# Patient Record
Sex: Male | Born: 1990 | Hispanic: Yes | Marital: Single | Smoking: Never smoker
Health system: Southern US, Community
[De-identification: ages and names within clinical notes are randomized; demographics above are authoritative.]

## PROBLEM LIST (undated history)

## (undated) ENCOUNTER — Emergency Department (HOSPITAL_COMMUNITY): Admission: EM | Payer: Self-pay

---

## 2014-06-07 ENCOUNTER — Emergency Department (HOSPITAL_COMMUNITY): Payer: No Typology Code available for payment source

## 2014-06-07 ENCOUNTER — Encounter (HOSPITAL_COMMUNITY): Payer: Self-pay | Admitting: *Deleted

## 2014-06-07 ENCOUNTER — Inpatient Hospital Stay (HOSPITAL_COMMUNITY)
Admission: EM | Admit: 2014-06-07 | Discharge: 2014-06-09 | DRG: 987 | Disposition: A | Discharge status: Home / Self Care | Payer: No Typology Code available for payment source | Attending: Otolaryngology | Admitting: Otolaryngology

## 2014-06-07 DIAGNOSIS — S065X9A Traumatic subdural hemorrhage with loss of consciousness of unspecified duration, initial encounter: Secondary | ICD-10-CM | POA: Diagnosis present

## 2014-06-07 DIAGNOSIS — S022XXA Fracture of nasal bones, initial encounter for closed fracture: Secondary | ICD-10-CM | POA: Diagnosis not present

## 2014-06-07 DIAGNOSIS — I6200 Nontraumatic subdural hemorrhage, unspecified: Secondary | ICD-10-CM | POA: Diagnosis not present

## 2014-06-07 DIAGNOSIS — S0081XA Abrasion of other part of head, initial encounter: Secondary | ICD-10-CM | POA: Diagnosis present

## 2014-06-07 DIAGNOSIS — G930 Cerebral cysts: Secondary | ICD-10-CM | POA: Diagnosis present

## 2014-06-07 DIAGNOSIS — S01112A Laceration without foreign body of left eyelid and periocular area, initial encounter: Secondary | ICD-10-CM | POA: Diagnosis present

## 2014-06-07 DIAGNOSIS — S065X0A Traumatic subdural hemorrhage without loss of consciousness, initial encounter: Secondary | ICD-10-CM | POA: Diagnosis present

## 2014-06-07 DIAGNOSIS — S065XAA Traumatic subdural hemorrhage with loss of consciousness status unknown, initial encounter: Secondary | ICD-10-CM | POA: Diagnosis present

## 2014-06-07 DIAGNOSIS — S01111A Laceration without foreign body of right eyelid and periocular area, initial encounter: Secondary | ICD-10-CM | POA: Diagnosis present

## 2014-06-07 DIAGNOSIS — Y92410 Unspecified street and highway as the place of occurrence of the external cause: Secondary | ICD-10-CM

## 2014-06-07 DIAGNOSIS — Z880 Allergy status to penicillin: Secondary | ICD-10-CM

## 2014-06-07 DIAGNOSIS — S0181XA Laceration without foreign body of other part of head, initial encounter: Secondary | ICD-10-CM | POA: Diagnosis present

## 2014-06-07 DIAGNOSIS — S1191XA Laceration without foreign body of unspecified part of neck, initial encounter: Secondary | ICD-10-CM | POA: Diagnosis present

## 2014-06-07 LAB — URINE MICROSCOPIC-ADD ON

## 2014-06-07 LAB — COMPREHENSIVE METABOLIC PANEL
ALK PHOS: 75 U/L (ref 39–117)
ALT: 56 U/L — AB (ref 0–53)
AST: 65 U/L — AB (ref 0–37)
Albumin: 4.1 g/dL (ref 3.5–5.2)
Anion gap: 9 (ref 5–15)
BUN: 12 mg/dL (ref 6–23)
CO2: 26 mmol/L (ref 19–32)
Calcium: 9.3 mg/dL (ref 8.4–10.5)
Chloride: 103 mEq/L (ref 96–112)
Creatinine, Ser: 0.99 mg/dL (ref 0.50–1.35)
GFR calc Af Amer: >90 mL/min (ref >90)
Glucose, Bld: 144 mg/dL — ABNORMAL HIGH (ref 70–99)
POTASSIUM: 3.6 mmol/L (ref 3.5–5.1)
SODIUM: 138 mmol/L (ref 135–145)
Total Bilirubin: 0.4 mg/dL (ref 0.3–1.2)
Total Protein: 6.8 g/dL (ref 6.0–8.3)

## 2014-06-07 LAB — URINALYSIS, ROUTINE W REFLEX MICROSCOPIC
Bilirubin Urine: NEGATIVE
GLUCOSE, UA: NEGATIVE mg/dL
Ketones, ur: NEGATIVE mg/dL
LEUKOCYTES UA: NEGATIVE
Nitrite: NEGATIVE
PROTEIN: NEGATIVE mg/dL
SPECIFIC GRAVITY, URINE: 1.045 — AB (ref 1.005–1.030)
Urobilinogen, UA: 0.2 mg/dL (ref 0.0–1.0)
pH: 6 (ref 5.0–8.0)

## 2014-06-07 LAB — CBC WITH DIFFERENTIAL/PLATELET
BASOS ABS: 0 10*3/uL (ref 0.0–0.1)
Basophils Relative: 0 % (ref 0–1)
Eosinophils Absolute: 0.1 10*3/uL (ref 0.0–0.7)
Eosinophils Relative: 0 % (ref 0–5)
HCT: 42 % (ref 39.0–52.0)
Hemoglobin: 14.6 g/dL (ref 13.0–17.0)
LYMPHS PCT: 10 % — AB (ref 12–46)
Lymphs Abs: 2.2 10*3/uL (ref 0.7–4.0)
MCH: 29.4 pg (ref 26.0–34.0)
MCHC: 34.8 g/dL (ref 30.0–36.0)
MCV: 84.7 fL (ref 78.0–100.0)
Monocytes Absolute: 1.3 10*3/uL — ABNORMAL HIGH (ref 0.1–1.0)
Monocytes Relative: 6 % (ref 3–12)
Neutro Abs: 18.7 10*3/uL — ABNORMAL HIGH (ref 1.7–7.7)
Neutrophils Relative %: 84 % — ABNORMAL HIGH (ref 43–77)
PLATELETS: 337 10*3/uL (ref 150–400)
RBC: 4.96 MIL/uL (ref 4.22–5.81)
RDW: 12.5 % (ref 11.5–15.5)
WBC: 22.3 10*3/uL — ABNORMAL HIGH (ref 4.0–10.5)

## 2014-06-07 LAB — PREPARE FRESH FROZEN PLASMA
UNIT DIVISION: 0
Unit division: 0

## 2014-06-07 LAB — LACTIC ACID, PLASMA: LACTIC ACID, VENOUS: 2.3 mmol/L — AB (ref 0.5–2.2)

## 2014-06-07 LAB — RAPID URINE DRUG SCREEN, HOSP PERFORMED
AMPHETAMINES: NOT DETECTED
BENZODIAZEPINES: NOT DETECTED
Barbiturates: NOT DETECTED
COCAINE: NOT DETECTED
Opiates: NOT DETECTED
TETRAHYDROCANNABINOL: NOT DETECTED

## 2014-06-07 LAB — I-STAT CG4 LACTIC ACID, ED: Lactic Acid, Venous: 2.42 mmol/L — ABNORMAL HIGH (ref 0.5–2.2)

## 2014-06-07 LAB — PROTIME-INR
INR: 1.04 (ref 0.00–1.49)
PROTHROMBIN TIME: 13.7 s (ref 11.6–15.2)

## 2014-06-07 LAB — ABO/RH: ABO/RH(D): O POS

## 2014-06-07 LAB — ETHANOL: Alcohol, Ethyl (B): <5 mg/dL (ref 0–9)

## 2014-06-07 MED ORDER — LIDOCAINE-EPINEPHRINE (PF) 2 %-1:200000 IJ SOLN
20.0000 mL | Freq: Once | INTRAMUSCULAR | Status: AC
  Administered 2014-06-07: 20 mL via INTRADERMAL
  Filled 2014-06-07: qty 20

## 2014-06-07 MED ORDER — MORPHINE SULFATE 2 MG/ML IJ SOLN
1.0000 mg | INTRAMUSCULAR | Status: DC | PRN
  Administered 2014-06-07 – 2014-06-08 (×2): 2 mg via INTRAVENOUS
  Filled 2014-06-07 (×3): qty 1

## 2014-06-07 MED ORDER — CIPROFLOXACIN IN D5W 400 MG/200ML IV SOLN
400.0000 mg | Freq: Once | INTRAVENOUS | Status: AC
  Administered 2014-06-07: 400 mg via INTRAVENOUS
  Filled 2014-06-07: qty 200

## 2014-06-07 MED ORDER — ONDANSETRON HCL 4 MG/2ML IJ SOLN
4.0000 mg | Freq: Four times a day (QID) | INTRAMUSCULAR | Status: DC | PRN
  Administered 2014-06-07 – 2014-06-08 (×2): 4 mg via INTRAVENOUS
  Filled 2014-06-07 (×2): qty 2

## 2014-06-07 MED ORDER — BACITRACIN-NEOMYCIN-POLYMYXIN OINTMENT TUBE
TOPICAL_OINTMENT | Freq: Three times a day (TID) | CUTANEOUS | Status: DC
  Administered 2014-06-08: 17:00:00 via TOPICAL
  Administered 2014-06-08: 1 via TOPICAL
  Administered 2014-06-09: 10:00:00 via TOPICAL
  Filled 2014-06-07 (×2): qty 15

## 2014-06-07 MED ORDER — BISACODYL 10 MG RE SUPP
10.0000 mg | Freq: Every day | RECTAL | Status: DC | PRN
Start: 2014-06-07 — End: 2014-06-09

## 2014-06-07 MED ORDER — PANTOPRAZOLE SODIUM 40 MG IV SOLR
40.0000 mg | Freq: Every day | INTRAVENOUS | Status: DC
  Administered 2014-06-08: 40 mg via INTRAVENOUS
  Filled 2014-06-07: qty 40

## 2014-06-07 MED ORDER — ONDANSETRON HCL 4 MG PO TABS: 4.0000 mg | ORAL_TABLET | Freq: Four times a day (QID) | ORAL | Status: DC | PRN

## 2014-06-07 MED ORDER — FENTANYL CITRATE 0.05 MG/ML IJ SOLN
50.0000 ug | Freq: Once | INTRAMUSCULAR | Status: AC
  Administered 2014-06-07: 50 ug via INTRAVENOUS
  Filled 2014-06-07: qty 2

## 2014-06-07 MED ORDER — IOHEXOL 350 MG/ML SOLN
100.0000 mL | Freq: Once | INTRAVENOUS | Status: AC | PRN
  Administered 2014-06-07: 100 mL via INTRAVENOUS

## 2014-06-07 MED ORDER — SODIUM CHLORIDE 0.9 % IV BOLUS (SEPSIS)
500.0000 mL | Freq: Once | INTRAVENOUS | Status: AC
  Administered 2014-06-07: 500 mL via INTRAVENOUS

## 2014-06-07 MED ORDER — ACETAMINOPHEN 325 MG PO TABS: 650.0000 mg | ORAL_TABLET | ORAL | Status: DC | PRN

## 2014-06-07 MED ORDER — OXYCODONE HCL 5 MG PO TABS
10.0000 mg | ORAL_TABLET | ORAL | Status: DC | PRN
  Administered 2014-06-08 – 2014-06-09 (×3): 10 mg via ORAL
  Filled 2014-06-07 (×3): qty 2

## 2014-06-07 MED ORDER — PANTOPRAZOLE SODIUM 40 MG PO TBEC: 40.0000 mg | DELAYED_RELEASE_TABLET | Freq: Every day | ORAL | Status: DC

## 2014-06-07 MED ORDER — OXYCODONE HCL 5 MG PO TABS
5.0000 mg | ORAL_TABLET | ORAL | Status: DC | PRN
  Administered 2014-06-08 – 2014-06-09 (×5): 5 mg via ORAL
  Filled 2014-06-07 (×5): qty 1

## 2014-06-07 MED ORDER — SODIUM CHLORIDE 0.9 % IV SOLN
INTRAVENOUS | Status: DC
  Administered 2014-06-07: 22:00:00 via INTRAVENOUS

## 2014-06-07 MED ORDER — DOCUSATE SODIUM 100 MG PO CAPS
100.0000 mg | ORAL_CAPSULE | Freq: Two times a day (BID) | ORAL | Status: DC
  Administered 2014-06-08 – 2014-06-09 (×2): 100 mg via ORAL
  Filled 2014-06-07 (×4): qty 1

## 2014-06-07 MED ORDER — ACETAMINOPHEN 325 MG PO TABS: 650.0000 mg | ORAL_TABLET | Freq: Once | ORAL | Status: DC

## 2014-06-07 MED ORDER — TETANUS-DIPHTH-ACELL PERTUSSIS 5-2.5-18.5 LF-MCG/0.5 IM SUSP
0.5000 mL | Freq: Once | INTRAMUSCULAR | Status: AC
  Administered 2014-06-07: 0.5 mL via INTRAMUSCULAR
  Filled 2014-06-07: qty 0.5

## 2014-06-07 NOTE — ED Notes (Signed)
Called and spoke with pts mother, received ok from pt to speak with her. Aram BeechamCynthia [phone].
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
## 2014-06-07 NOTE — ED Notes (Signed)
C-Collar removed per Dr. Lindie SpruceWyatt

## 2014-06-07 NOTE — ED Notes (Signed)
Larry [id number] pt arrives in CT, alert and oriented. NAD

## 2014-06-07 NOTE — ED Notes (Signed)
Pt returned from CT, alert and oriented x4

## 2014-06-07 NOTE — ED Notes (Signed)
Pt talking to mother at this time.
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
## 2014-06-07 NOTE — ED Notes (Signed)
Dr. Emeline DarlingGore at bedside to suture pt lacerations.

## 2014-06-07 NOTE — ED Notes (Signed)
Lab notified for follow up on pt lab results

## 2014-06-07 NOTE — ED Notes (Signed)
NOTIFIED DR. Bebe ShaggyWICKLINE IN PERSON FOR PATIENTS LAB RESULTS OF CG4+LACTIC ACID @18 :47 PM ,06/07/2014.
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
## 2014-06-07 NOTE — ED Notes (Signed)
Dr. Lindie SpruceWyatt at bedside to suture.
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
## 2014-06-07 NOTE — ED Notes (Signed)
This RN attempted wound cleaning on pt face, pt states very painful.

## 2014-06-08 ENCOUNTER — Observation Stay (HOSPITAL_COMMUNITY): Payer: No Typology Code available for payment source

## 2014-06-08 DIAGNOSIS — S1191XA Laceration without foreign body of unspecified part of neck, initial encounter: Secondary | ICD-10-CM | POA: Diagnosis present

## 2014-06-08 DIAGNOSIS — S0181XA Laceration without foreign body of other part of head, initial encounter: Secondary | ICD-10-CM | POA: Diagnosis present

## 2014-06-08 DIAGNOSIS — S0081XA Abrasion of other part of head, initial encounter: Secondary | ICD-10-CM | POA: Diagnosis present

## 2014-06-08 DIAGNOSIS — S022XXA Fracture of nasal bones, initial encounter for closed fracture: Secondary | ICD-10-CM | POA: Diagnosis present

## 2014-06-08 DIAGNOSIS — S065X0A Traumatic subdural hemorrhage without loss of consciousness, initial encounter: Secondary | ICD-10-CM | POA: Diagnosis present

## 2014-06-08 DIAGNOSIS — G930 Cerebral cysts: Secondary | ICD-10-CM | POA: Diagnosis present

## 2014-06-08 DIAGNOSIS — S01111A Laceration without foreign body of right eyelid and periocular area, initial encounter: Secondary | ICD-10-CM | POA: Diagnosis present

## 2014-06-08 DIAGNOSIS — I6200 Nontraumatic subdural hemorrhage, unspecified: Secondary | ICD-10-CM | POA: Diagnosis present

## 2014-06-08 DIAGNOSIS — Z880 Allergy status to penicillin: Secondary | ICD-10-CM | POA: Diagnosis not present

## 2014-06-08 DIAGNOSIS — S01112A Laceration without foreign body of left eyelid and periocular area, initial encounter: Secondary | ICD-10-CM | POA: Diagnosis present

## 2014-06-08 DIAGNOSIS — Y92410 Unspecified street and highway as the place of occurrence of the external cause: Secondary | ICD-10-CM | POA: Diagnosis not present

## 2014-06-08 LAB — BASIC METABOLIC PANEL
Anion gap: 12 (ref 5–15)
BUN: 8 mg/dL (ref 6–23)
CHLORIDE: 104 meq/L (ref 96–112)
CO2: 19 mmol/L (ref 19–32)
Calcium: 8.8 mg/dL (ref 8.4–10.5)
Creatinine, Ser: 0.75 mg/dL (ref 0.50–1.35)
GFR calc Af Amer: >90 mL/min (ref >90)
GFR calc non Af Amer: >90 mL/min (ref >90)
GLUCOSE: 141 mg/dL — AB (ref 70–99)
POTASSIUM: 3.7 mmol/L (ref 3.5–5.1)
Sodium: 135 mmol/L (ref 135–145)

## 2014-06-08 LAB — CBC
HCT: 39.1 % (ref 39.0–52.0)
HEMOGLOBIN: 13.3 g/dL (ref 13.0–17.0)
MCH: 28.1 pg (ref 26.0–34.0)
MCHC: 34 g/dL (ref 30.0–36.0)
MCV: 82.7 fL (ref 78.0–100.0)
Platelets: 291 10*3/uL (ref 150–400)
RBC: 4.73 MIL/uL (ref 4.22–5.81)
RDW: 12.3 % (ref 11.5–15.5)
WBC: 14.8 10*3/uL — ABNORMAL HIGH (ref 4.0–10.5)

## 2014-06-08 LAB — TYPE AND SCREEN
ABO/RH(D): O POS
ANTIBODY SCREEN: NEGATIVE
Unit division: 0
Unit division: 0

## 2014-06-08 MED ORDER — CHLORHEXIDINE GLUCONATE 0.12 % MT SOLN
15.0000 mL | Freq: Two times a day (BID) | OROMUCOSAL | Status: DC
  Administered 2014-06-08: 15 mL via OROMUCOSAL
  Filled 2014-06-08: qty 15

## 2014-06-08 MED ORDER — CETYLPYRIDINIUM CHLORIDE 0.05 % MT LIQD
7.0000 mL | Freq: Two times a day (BID) | OROMUCOSAL | Status: DC
  Administered 2014-06-08 – 2014-06-09 (×3): 7 mL via OROMUCOSAL

## 2014-06-08 NOTE — Progress Notes (Signed)
UR completed 
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
## 2014-06-08 NOTE — Progress Notes (Signed)
Patient ID: Larry Hubbard, male   DOB: 1991-05-24, 23 y.o.   MRN: [id number] Stable, no weakness, mentally normal  Ct stable.. No surgical lesion, tentorial sdh no betteror worse

## 2014-06-09 DIAGNOSIS — G930 Cerebral cysts: Secondary | ICD-10-CM | POA: Diagnosis present

## 2014-06-09 MED ORDER — OXYCODONE-ACETAMINOPHEN 5-325 MG PO TABS: 1.0000 | ORAL_TABLET | ORAL | Status: AC | PRN
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
## 2014-06-09 NOTE — Discharge Instructions (Signed)
Wash wounds daily in shower with soap and water. Apply antibiotic ointment (e.g. Neosporin) twice daily and as needed to keep moist. Cover with dry dressing.  No driving while taking oxycodone.
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
## 2014-06-09 NOTE — Progress Notes (Signed)
Patient d/c home. D/c instruction given and patient verbalized understanding. Condition fair.

## 2014-06-09 NOTE — Progress Notes (Signed)
Pt arrived to room 4N06 from neruo ICU. Pt is alert and oriented. Pt is stating pain in his neck and face.  Will continue to monitor.

## 2014-06-12 LAB — BLOOD PRODUCT ORDER (VERBAL) VERIFICATION
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
## 2019-10-22 ENCOUNTER — Other Ambulatory Visit: Payer: Self-pay
